# Patient Record
Sex: Male | Born: 1990 | Race: White | Hispanic: No | Marital: Single | Smoking: Former smoker
Health system: Southern US, Community
[De-identification: ages and names within clinical notes are randomized; demographics above are authoritative.]

## PROBLEM LIST (undated history)

## (undated) HISTORY — PX: SPINE SURGERY: SHX786

---
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
## 2010-09-24 LAB — CBC
HCT: 46.4 % (ref 39.0–52.0)
MCHC: 34.6 g/dL (ref 30.0–36.0)
MCV: 91.4 fL (ref 78.0–100.0)
Platelets: 190 10*3/uL (ref 150–400)
RDW: 13.1 % (ref 11.5–15.5)

## 2010-09-24 LAB — BASIC METABOLIC PANEL
BUN: 10 mg/dL (ref 6–23)
CO2: 25 mEq/L (ref 19–32)
Chloride: 106 mEq/L (ref 96–112)
Glucose, Bld: 88 mg/dL (ref 70–99)
Potassium: 3.4 mEq/L — ABNORMAL LOW (ref 3.5–5.1)

## 2010-09-24 LAB — DIFFERENTIAL
Basophils Absolute: 0 10*3/uL (ref 0.0–0.1)
Eosinophils Absolute: 0.1 10*3/uL (ref 0.0–0.7)
Eosinophils Relative: 1 % (ref 0–5)
Monocytes Absolute: 0.7 10*3/uL (ref 0.1–1.0)

## 2012-06-01 ENCOUNTER — Emergency Department
Admission: EM | Admit: 2012-06-01 | Discharge: 2012-06-01 | Disposition: A | Discharge status: Home / Self Care | Payer: 59 | Source: Home / Self Care | Attending: Emergency Medicine | Admitting: Emergency Medicine

## 2012-06-01 ENCOUNTER — Encounter: Payer: Self-pay | Admitting: *Deleted

## 2012-06-01 DIAGNOSIS — J029 Acute pharyngitis, unspecified: Secondary | ICD-10-CM

## 2012-06-01 DIAGNOSIS — J069 Acute upper respiratory infection, unspecified: Secondary | ICD-10-CM

## 2012-06-01 LAB — POCT RAPID STREP A (OFFICE): Rapid Strep A Screen: NEGATIVE

## 2012-06-01 MED ORDER — AZITHROMYCIN 250 MG PO TABS: ORAL_TABLET | ORAL | Status: DC

## 2012-06-01 NOTE — ED Provider Notes (Signed)
History     CSN: [id number]  Arrival date & time 06/01/12  1448   First MD Initiated Contact with Patient 06/01/12 1505      Chief Complaint  Patient presents with  . Allergic Rhinitis   . Nasal Congestion    (Consider location/radiation/quality/duration/timing/severity/associated sxs/prior treatment) HPI Edward Hughes is a 21 y.o. male who complains of onset of cold symptoms for 2-3 days.  The symptoms are constant and mild-moderate in severity.  Father is also sick.  He missed work today and needs a note.  Works at [employer].   + sore throat + cough No pleuritic pain No wheezing + nasal congestion.  Has a h/o allergic rhinitis. + post-nasal drainage + sinus pain/pressure No chest congestion No itchy/red eyes No earache No hemoptysis No SOB + chills/sweats (mild today) + fever (102 the first night) No nausea No vomiting No abdominal pain No diarrhea No skin rashes + fatigue No myalgias No headache    History reviewed. No pertinent past medical history.  History reviewed. No pertinent past surgical history.  Family History  Problem Relation Age of Onset  . Hyperlipidemia Mother   . Hypertension Mother     History  Substance Use Topics  . Smoking status: Former Games developer  . Smokeless tobacco: Not on file  . Alcohol Use: No      Review of Systems  All other systems reviewed and are negative.    Allergies  Review of patient's allergies indicates no known allergies.  Home Medications   Current Outpatient Rx  Name  Route  Sig  Dispense  Refill  . AZITHROMYCIN 250 MG PO TABS      Use as directed   1 each   0     BP 128/83  Pulse 92  Temp 98.1 F (36.7 C) (Oral)  Resp 16  Ht 6\' 2"  (1.88 m)  Wt 143 lb (64.864 kg)  BMI 18.36 kg/m2  SpO2 97%  Physical Exam  Nursing note and vitals reviewed. Constitutional: He is oriented to person, place, and time. He appears well-developed and well-nourished.  HENT:  Head: Normocephalic and atraumatic.   Right Ear: Tympanic membrane, external ear and ear canal normal.  Left Ear: Tympanic membrane, external ear and ear canal normal.  Nose: Mucosal edema and rhinorrhea present.  Mouth/Throat: Posterior oropharyngeal erythema present. No oropharyngeal exudate or posterior oropharyngeal edema.  Eyes: No scleral icterus.  Neck: Neck supple.  Cardiovascular: Regular rhythm and normal heart sounds.   Pulmonary/Chest: Effort normal and breath sounds normal. No respiratory distress.  Neurological: He is alert and oriented to person, place, and time.  Skin: Skin is warm and dry.  Psychiatric: He has a normal mood and affect. His speech is normal.    ED Course  Procedures (including critical care time)   Labs Reviewed  POCT RAPID STREP A (OFFICE)   No results found.   1. Acute upper respiratory infections of unspecified site   2. Acute pharyngitis       MDM  1)  Take the prescribed antibiotic as instructed.  Wait for a few days since likely still viral.  If worsening or new symptoms, can take.  Rapid strep neg.  No culture done. 2)  Use nasal saline solution (over the counter) at least 3 times a day. 3)  Use over the counter decongestants like Zyrtec-D every 12 hours as needed to help with congestion.  If you have hypertension, do not take medicines with sudafed.  4)  Can take tylenol  every 6 hours or motrin every 8 hours for pain or fever. 5)  Follow up with your primary doctor if no improvement in 5-7 days, sooner if increasing pain, fever, or new symptoms.          Marlaine Hind, MD 06/01/12 [phone]

## 2012-06-01 NOTE — ED Notes (Signed)
Patient c/o rhinitis, headache, dry cough, and sore throat x 2 days. Has tried OTC Mucinex DM and Tylenol.

## 2013-05-12 ENCOUNTER — Telehealth: Payer: Self-pay | Admitting: Nurse Practitioner

## 2013-05-12 NOTE — Telephone Encounter (Signed)
APPT MADE

## 2013-05-13 ENCOUNTER — Encounter (INDEPENDENT_AMBULATORY_CARE_PROVIDER_SITE_OTHER): Payer: Self-pay

## 2013-05-13 ENCOUNTER — Ambulatory Visit (INDEPENDENT_AMBULATORY_CARE_PROVIDER_SITE_OTHER): Payer: 59 | Admitting: Family Medicine

## 2013-05-13 ENCOUNTER — Encounter: Payer: Self-pay | Admitting: Family Medicine

## 2013-05-13 ENCOUNTER — Ambulatory Visit: Payer: Self-pay | Admitting: Family Medicine

## 2013-05-13 VITALS — BP 124/82 | HR 70 | Temp 98.0°F | Ht 74.0 in | Wt 163.2 lb

## 2013-05-13 DIAGNOSIS — J069 Acute upper respiratory infection, unspecified: Secondary | ICD-10-CM

## 2013-05-13 NOTE — Progress Notes (Signed)
   Subjective:    Patient ID: Edward Hughes, male    DOB: 1990-06-23, 22 y.o.   MRN: [id number]  HPI This 22 y.o. male presents for evaluation of URI sx's for over a week.  He states He has been having mucopurulent sinus drainage for over a week.  He has sinus Pressure and he has a cough.   Review of Systems    No chest pain, SOB, HA, dizziness, vision change, N/V, diarrhea, constipation, dysuria, urinary urgency or frequency, myalgias, arthralgias or rash.  Objective:   Physical Exam  Vital signs noted  Well developed well nourished male.  HEENT - Head atraumatic Normocephalic                Eyes - PERRLA, Conjuctiva - clear Sclera- Clear EOMI                Ears - EAC's Wnl TM's Wnl Gross Hearing WNL                Throat - oropharanx wnl Respiratory - Lungs CTA bilateral Cardiac - RRR S1 and S2 without murmur GI - Abdomen soft Nontender and bowel sounds active x 4      Assessment & Plan:   URI (upper respiratory infection) Amoxicillin 250mg /66ml two tsp po tid x 10 days #314ml Tessalon perles 100mg  one po tid prn cough #30 Push po fluids, rest, and take tylenol and motrin otc as directed Work note for yesterday given.  Deatra Canter FNP

## 2014-08-03 ENCOUNTER — Telehealth: Payer: Self-pay | Admitting: Family Medicine

## 2014-08-03 NOTE — Telephone Encounter (Signed)
Appointment scheduled for tomorrow with Christell ConstantMoore.

## 2014-08-04 ENCOUNTER — Encounter: Payer: Self-pay | Admitting: Family Medicine

## 2014-08-04 ENCOUNTER — Ambulatory Visit (INDEPENDENT_AMBULATORY_CARE_PROVIDER_SITE_OTHER): Payer: 59 | Admitting: Family Medicine

## 2014-08-04 VITALS — BP 132/89 | HR 71 | Temp 97.0°F | Ht 74.0 in | Wt 175.0 lb

## 2014-08-04 DIAGNOSIS — J069 Acute upper respiratory infection, unspecified: Secondary | ICD-10-CM

## 2014-08-04 DIAGNOSIS — H6983 Other specified disorders of Eustachian tube, bilateral: Secondary | ICD-10-CM

## 2014-08-04 DIAGNOSIS — M67431 Ganglion, right wrist: Secondary | ICD-10-CM

## 2014-08-04 MED ORDER — AMOXICILLIN 250 MG/5ML PO SUSR: 500.0000 mg | Freq: Three times a day (TID) | ORAL | Status: DC

## 2014-08-04 NOTE — Progress Notes (Signed)
Subjective:    Patient ID: Edward Hughes, male    DOB: 02/20/1991, 24 y.o.   MRN: [id number]  HPI 24 year old male comes in today with complaint of bilateral ear pain for 1 week. He has taken Sudafed with no relief. Last taken yesterday.  He also complains of a nodule on his right wrist. No known injury.  There are no active problems to display for this patient.  Outpatient Encounter Prescriptions as of 08/04/2014  Medication Sig  . [DISCONTINUED] azithromycin (ZITHROMAX Z-PAK) 250 MG tablet Use as directed     Review of Systems  Constitutional: Negative.   HENT: Positive for ear pain (bilateral), rhinorrhea and sneezing. Negative for ear discharge, sinus pressure and sore throat.   Eyes: Negative.   Respiratory: Negative.   Cardiovascular: Negative.   Gastrointestinal: Negative.   Endocrine: Negative.   Genitourinary: Negative.   Musculoskeletal: Negative.   Skin: Negative.   Allergic/Immunologic: Negative.   Neurological: Positive for headaches.  Hematological: Negative.   Psychiatric/Behavioral: Negative.        Objective:   Physical Exam  Constitutional: He is oriented to person, place, and time. He appears well-developed and well-nourished. No distress.  HENT:  Head: Normocephalic and atraumatic.  Right Ear: External ear normal.  Left Ear: External ear normal.  Mouth/Throat: Oropharynx is clear and moist. No oropharyngeal exudate.  Nasal congestion left greater than right  Eyes: Conjunctivae and EOM are normal. Pupils are equal, round, and reactive to light. Right eye exhibits no discharge. Left eye exhibits no discharge. No scleral icterus.  Neck: Normal range of motion. Neck supple. No thyromegaly present.  No anterior cervical adenopathy or tenderness  Cardiovascular: Normal rate, regular rhythm and normal heart sounds.  Exam reveals no friction rub.   No murmur heard. Pulmonary/Chest: Effort normal and breath sounds normal. No respiratory distress. He has no  wheezes. He has no rales. He exhibits no tenderness.  Dry cough  Abdominal: Soft. Bowel sounds are normal. He exhibits no mass. There is no tenderness. There is no rebound and no guarding.  Musculoskeletal: Normal range of motion. He exhibits no tenderness.  Lymphadenopathy:    He has no cervical adenopathy.  Neurological: He is alert and oriented to person, place, and time.  Skin: Skin is warm and dry. No rash noted.  Psychiatric: He has a normal mood and affect. His behavior is normal. Judgment and thought content normal.  Nursing note and vitals reviewed.   BP 132/89 mmHg  Pulse 71  Temp(Src) 97 F (36.1 C) (Oral)  Ht 6\' 2"  (1.88 m)  Wt 175 lb (79.379 kg)  BMI 22.46 kg/m2  Repeat blood pressure 128/85 left arm sitting      Assessment & Plan:  1. URI (upper respiratory infection) -Patient should take Tylenol or ibuprofen as needed for aches and pains - amoxicillin (AMOXIL) 250 MG/5ML suspension; Take 10 mLs (500 mg total) by mouth 3 (three) times daily.  Dispense: 150 mL; Refill: 0  2. Eustachian tube dysfunction, bilateral -He should try some Claritin over-the-counter for congestion - amoxicillin (AMOXIL) 250 MG/5ML suspension; Take 10 mLs (500 mg total) by mouth 3 (three) times daily.  Dispense: 150 mL; Refill: 0  3. Ganglion cyst of wrist, right -If he continues to have problems with the wrist i.e. increased pain or enlarging ganglion, the patient should get back in touch with us and we will arrange for him to see the orthopedic surgeon  Patient Instructions  Use a cool mist humidifier  in your home  Keep the home as cool as possible and drink plenty of fluids Use nasal saline and Mucinex if needed for cough and congestion Take antibiotic until completed If you develop pain with your wrist where this ganglion cyst is located please get back in touch with [place] and we will have an orthopedic surgeon see you Take Tylenol or ibuprofen as needed for aches pains and  fever   Nyra Capes MD

## 2014-08-04 NOTE — Patient Instructions (Addendum)
Use a cool mist humidifier in your home  Keep the home as cool as possible and drink plenty of fluids Use nasal saline and Mucinex if needed for cough and congestion Take antibiotic until completed If you develop pain with your wrist where this ganglion cyst is located please get back in touch with us and we will have an orthopedic surgeon see you Take Tylenol or ibuprofen as needed for aches pains and fever

## 2014-12-22 ENCOUNTER — Telehealth: Payer: Self-pay | Admitting: Family Medicine

## 2014-12-23 NOTE — Telephone Encounter (Signed)
[facility]oke with pt's mother and [facility]

## 2015-11-02 ENCOUNTER — Ambulatory Visit (INDEPENDENT_AMBULATORY_CARE_PROVIDER_SITE_OTHER): Payer: Self-pay | Admitting: Physician Assistant

## 2015-11-02 ENCOUNTER — Encounter: Payer: Self-pay | Admitting: Physician Assistant

## 2015-11-02 VITALS — BP 136/96 | HR 81 | Temp 98.1°F | Ht 74.0 in | Wt 186.0 lb

## 2015-11-02 DIAGNOSIS — H109 Unspecified conjunctivitis: Secondary | ICD-10-CM

## 2015-11-02 MED ORDER — TOBRAMYCIN 0.3 % OP SOLN: 2.0000 [drp] | OPHTHALMIC | Status: DC

## 2015-11-02 NOTE — Patient Instructions (Addendum)
Bacterial Conjunctivitis Bacterial conjunctivitis, commonly called pink eye, is an inflammation of the clear membrane that covers the white part of the eye (conjunctiva). The inflammation can also happen on the underside of the eyelids. The blood vessels in the conjunctiva become inflamed, causing the eye to become red or pink. Bacterial conjunctivitis may spread easily from one eye to another and from person to person (contagious).  CAUSES  Bacterial conjunctivitis is caused by bacteria. The bacteria may come from your own skin, your upper respiratory tract, or from someone else with bacterial conjunctivitis. SYMPTOMS  The normally white color of the eye or the underside of the eyelid is usually pink or red. The pink eye is usually associated with irritation, tearing, and some sensitivity to light. Bacterial conjunctivitis is often associated with a thick, yellowish discharge from the eye. The discharge may turn into a crust on the eyelids overnight, which causes your eyelids to stick together. If a discharge is present, there may also be some blurred vision in the affected eye. DIAGNOSIS  Bacterial conjunctivitis is diagnosed by your caregiver through an eye exam and the symptoms that you report. Your caregiver looks for changes in the surface tissues of your eyes, which may point to the specific type of conjunctivitis. A sample of any discharge may be collected on a cotton-tip swab if you have a severe case of conjunctivitis, if your cornea is affected, or if you keep getting repeat infections that do not respond to treatment. The sample will be sent to a lab to see if the inflammation is caused by a bacterial infection and to see if the infection will respond to antibiotic medicines. TREATMENT   Bacterial conjunctivitis is treated with antibiotics. Antibiotic eyedrops are most often used. However, antibiotic ointments are also available. Antibiotics pills are sometimes used. Artificial tears or eye  washes may ease discomfort. HOME CARE INSTRUCTIONS   To ease discomfort, apply a cool, clean washcloth to your eye for 10-20 minutes, 3-4 times a day.  Gently wipe away any drainage from your eye with a warm, wet washcloth or a cotton ball.  Wash your hands often with soap and water. Use paper towels to dry your hands.  Do not share towels or washcloths. This may spread the infection.  Change or wash your pillowcase every day.  You should not use eye makeup until the infection is gone.  Do not operate machinery or drive if your vision is blurred.  Stop using contact lenses. Ask your caregiver how to sterilize or replace your contacts before using them again. This depends on the type of contact lenses that you use.  When applying medicine to the infected eye, do not touch the edge of your eyelid with the eyedrop bottle or ointment tube. SEEK IMMEDIATE MEDICAL CARE IF:   Your infection has not improved within 3 days after beginning treatment.  You had yellow discharge from your eye and it returns.  You have increased eye pain.  Your eye redness is spreading.  Your vision becomes blurred.  You have a fever or persistent symptoms for more than 2-3 days.  You have a fever and your symptoms suddenly get worse.  You have facial pain, redness, or swelling. MAKE SURE YOU:   Understand these instructions.  Will watch your condition.  Will get help right away if you are not doing well or get worse.   This information is not intended to replace advice given to you by your health care provider. Make sure you   discuss any questions you have with your health care provider.   Document Released: 06/04/2005 Document Revised: 06/25/2014 Document Reviewed: 11/05/2011 Elsevier Interactive Patient Education 2016 [employer] Corneal Abrasion The cornea is the clear covering at the front and center of the eye. When looking at the colored portion of the eye (iris), you are looking through  the cornea. This very thin tissue is made up of many layers. The surface layer is a single layer of cells (corneal epithelium) and is one of the most sensitive tissues in the body. If a scratch or injury causes the corneal epithelium to come off, it is called a corneal abrasion. If the injury extends to the tissues below the epithelium, the condition is called a corneal ulcer. CAUSES   Scratches.  Trauma.  Foreign body in the eye. Some people have recurrences of abrasions in the area of the original injury even after it has healed (recurrent erosion syndrome). Recurrent erosion syndrome generally improves and goes away with time. SYMPTOMS   Eye pain.  Difficulty or inability to keep the injured eye open.  The eye becomes very sensitive to light.  Recurrent erosions tend to happen suddenly, first thing in the morning, usually after waking up and opening the eye. DIAGNOSIS  Your health care provider can diagnose a corneal abrasion during an eye exam. Dye is usually placed in the eye using a drop or a small paper strip moistened by your tears. When the eye is examined with a special light, the abrasion shows up clearly because of the dye. TREATMENT   Small abrasions may be treated with antibiotic drops or ointment alone.  A pressure patch may be put over the eye. If this is done, follow your doctor's instructions for when to remove the patch. Do not drive or use machines while the eye patch is on. Judging distances is hard to do with a patch on. If the abrasion becomes infected and spreads to the deeper tissues of the cornea, a corneal ulcer can result. This is serious because it can cause corneal scarring. Corneal scars interfere with light passing through the cornea and cause a loss of vision in the involved eye. HOME CARE INSTRUCTIONS  Use medicine or ointment as directed. Only take over-the-counter or prescription medicines for pain, discomfort, or fever as directed by your health care  provider.  Do not drive or operate machinery if your eye is patched. Your ability to judge distances is impaired.  If your health care provider has given you a follow-up appointment, it is very important to keep that appointment. Not keeping the appointment could result in a severe eye infection or permanent loss of vision. If there is any problem keeping the appointment, let your health care provider know. SEEK MEDICAL CARE IF:   You have pain, light sensitivity, and a scratchy feeling in one eye or both eyes.  Your pressure patch keeps loosening up, and you can blink your eye under the patch after treatment.  Any kind of discharge develops from the eye after treatment or if the lids stick together in the morning.  You have the same symptoms in the morning as you did with the original abrasion days, weeks, or months after the abrasion healed.   This information is not intended to replace advice given to you by your health care provider. Make sure you discuss any questions you have with your health care provider.   Document Released: 06/01/2000 Document Revised: 02/23/2015 Document Reviewed: 02/09/2013 Elsevier Interactive Patient Education  2016 [employer].

## 2015-11-02 NOTE — Progress Notes (Signed)
Subjective:     Patient ID: Dolores HooseDustin M Jr, male   DOB: 05/12/1991, 25 y.o.   MRN: [id number]  HPI Pt with pain and redness to the L eye Sx have improved but with some matting Denies change in vision  Review of Systems Denies photophobia No change in vision + swelling to the to the lid    Objective:   Physical Exam + upper lid edema No matting to the lashes on the L ey + conjunct. Erythema PERRLA EOMI Fundo nl Fluro stain- no abrasion No pre-aur nodes   Assessment:     1. Conjunctivitis of left eye        Plan:     Discussed this may of been an abrasion that has improved Tobrex to cover possible infection Freq handwashing F/U prn

## 2015-11-04 ENCOUNTER — Telehealth: Payer: Self-pay | Admitting: Family Medicine

## 2015-11-04 NOTE — Telephone Encounter (Signed)
Pt informed eye gtts Rx'd other day was an abx which will take care of this.

## 2018-07-23 ENCOUNTER — Encounter: Payer: Self-pay | Admitting: Physician Assistant

## 2018-07-23 ENCOUNTER — Ambulatory Visit: Payer: Commercial Managed Care - PPO | Admitting: Physician Assistant

## 2018-07-23 VITALS — BP 148/93 | HR 95 | Temp 97.8°F | Ht 74.0 in | Wt 206.0 lb

## 2018-07-23 DIAGNOSIS — B349 Viral infection, unspecified: Secondary | ICD-10-CM

## 2018-07-23 DIAGNOSIS — R52 Pain, unspecified: Secondary | ICD-10-CM | POA: Diagnosis not present

## 2018-07-23 LAB — VERITOR FLU A/B WAIVED
Influenza A: NEGATIVE
Influenza B: NEGATIVE

## 2018-07-23 NOTE — Progress Notes (Signed)
BP (!) 148/93   Pulse 95   Temp 97.8 F (36.6 C) (Oral)   Ht 6\' 2"  (1.88 m)   Wt 206 lb (93.4 kg)   BMI 26.45 kg/m    Subjective:    Patient ID: Edward Hughes, male    DOB: February 03, 1991, 28 y.o.   MRN: [id number]  HPI: Edward Hughes is a 28 y.o. male presenting on 07/23/2018 for Headache; Cough; and Generalized Body Aches    No past medical history on file. Relevant past medical, surgical, family and social history reviewed and updated as indicated. Interim medical history since our last visit reviewed. Allergies and medications reviewed and updated. DATA REVIEWED: CHART IN EPIC  Family History reviewed for pertinent findings.  Review of Systems  Constitutional: Positive for fatigue. Negative for appetite change.  HENT: Positive for sinus pressure and sore throat.   Eyes: Negative.  Negative for pain and visual disturbance.  Respiratory: Positive for shortness of breath and wheezing. Negative for cough and chest tightness.   Cardiovascular: Negative.  Negative for chest pain, palpitations and leg swelling.  Gastrointestinal: Negative.  Negative for abdominal pain, diarrhea, nausea and vomiting.  Endocrine: Negative.   Genitourinary: Negative.   Musculoskeletal: Positive for back pain and myalgias.  Skin: Negative.  Negative for color change and rash.  Neurological: Positive for headaches. Negative for weakness and numbness.  Psychiatric/Behavioral: Negative.     Allergies as of 07/23/2018   No Known Allergies     Medication List    as of July 23, 2018 10:32 AM   You have not been prescribed any medications.        Objective:    BP (!) 148/93   Pulse 95   Temp 97.8 F (36.6 C) (Oral)   Ht 6\' 2"  (1.88 m)   Wt 206 lb (93.4 kg)   BMI 26.45 kg/m   No Known Allergies  Wt Readings from Last 3 Encounters:  07/23/18 206 lb (93.4 kg)  11/02/15 186 lb (84.4 kg)  08/04/14 175 lb (79.4 kg)    Physical Exam Vitals signs and nursing note reviewed.    Constitutional:      General: He is not in acute distress.    Appearance: He is well-developed.  HENT:     Head: Normocephalic and atraumatic.     Right Ear: Tympanic membrane and ear canal normal. No drainage. No middle ear effusion.     Left Ear: Tympanic membrane and ear canal normal. No drainage.  No middle ear effusion.     Nose: Mucosal edema and rhinorrhea present.     Right Sinus: No maxillary sinus tenderness.     Left Sinus: No maxillary sinus tenderness.     Mouth/Throat:     Pharynx: Uvula midline. Posterior oropharyngeal erythema present. No oropharyngeal exudate.  Eyes:     General:        Right eye: No discharge.        Left eye: No discharge.     Conjunctiva/sclera: Conjunctivae normal.     Pupils: Pupils are equal, round, and reactive to light.  Neck:     Musculoskeletal: Normal range of motion.  Cardiovascular:     Rate and Rhythm: Normal rate and regular rhythm.     Heart sounds: Normal heart sounds.  Pulmonary:     Effort: Pulmonary effort is normal. No respiratory distress.     Breath sounds: Normal breath sounds. No wheezing.  Abdominal:     Palpations: Abdomen is soft.  Lymphadenopathy:     Cervical: No cervical adenopathy.  Skin:    General: Skin is warm and dry.  Neurological:     Mental Status: He is alert and oriented to person, place, and time.  Psychiatric:        Behavior: Behavior normal.         Assessment & Plan:   1. Body aches - Veritor Flu A/B Waived  2. Viral illness NEGATIVE FLU - Veritor Flu A/B Waived - Take meds as prescribed - Use a cool mist humidifier  -Use saline nose sprays frequently -Force fluids -For any cough or congestion  Use plain Mucinex- regular strength or max strength is fine -For fever or aces or pains- take tylenol or ibuprofen. -Throat lozenges if help -New toothbrush in 3 days  Delsym 2-3 tsp BID for cough     Continue all other maintenance medications as listed above.  Follow up plan: No  follow-ups on file.  Educational handout given for viral support  Remus Loffler PA-C [facility] [address]  [place], [place] [address] [phone]   07/23/2018, 10:32 AM

## 2018-07-23 NOTE — Patient Instructions (Signed)
-   Take meds as prescribed - Use a cool mist humidifier  -Use saline nose sprays frequently -Force fluids -For any cough or congestion  Use plain Mucinex- regular strength or max strength is fine -For fever or aces or pains- take tylenol or ibuprofen. -Throat lozenges if help -New toothbrush in 3 days  Delsym 3 tsp BID

## 2019-04-29 ENCOUNTER — Ambulatory Visit (INDEPENDENT_AMBULATORY_CARE_PROVIDER_SITE_OTHER): Payer: Self-pay | Admitting: Family Medicine

## 2019-04-29 ENCOUNTER — Encounter: Payer: Self-pay | Admitting: Family Medicine

## 2019-04-29 ENCOUNTER — Other Ambulatory Visit: Payer: Self-pay

## 2019-04-29 DIAGNOSIS — Z20822 Contact with and (suspected) exposure to covid-19: Secondary | ICD-10-CM

## 2019-04-29 DIAGNOSIS — J069 Acute upper respiratory infection, unspecified: Secondary | ICD-10-CM

## 2019-04-29 MED ORDER — FLUTICASONE PROPIONATE 50 MCG/ACT NA SUSP: 1.0000 | Freq: Two times a day (BID) | NASAL | 6 refills | Status: AC | PRN

## 2019-04-29 NOTE — Progress Notes (Signed)
   Virtual Visit via telephone Note  I connected with Edward Hughes on 04/29/19 at 1326 by telephone and verified that I am speaking with the correct person using two identifiers. Edward Hughes is currently located at home and no other people are currently with her during visit. The provider, Fransisca Kaufmann Hinton Luellen, MD is located in their office at time of visit.  Call ended at 1335  I discussed the limitations, risks, security and privacy concerns of performing an evaluation and management service by telephone and the availability of in person appointments. I also discussed with the patient that there may be a patient responsible charge related to this service. The patient expressed understanding and agreed to proceed.   History and Present Illness: Patient is calling in with complaints of headaches and congestion and body aches.  He felt feverish and chills and it started yesterday.  He has voice change but no sore throat or shortness of breath. He took nyquil last night and feels better except sinus headache. He denies any sick contacts at work that he knows of.  His ears have pressure.   No diagnosis found.  No outpatient encounter medications on file as of 04/29/2019.   No facility-administered encounter medications on file as of 04/29/2019.     Review of Systems  Constitutional: Positive for chills and fever.  HENT: Positive for congestion, ear pain, postnasal drip, rhinorrhea, sinus pressure, sneezing and sore throat. Negative for ear discharge and voice change.   Eyes: Negative for pain, discharge, redness and visual disturbance.  Respiratory: Negative for shortness of breath and wheezing.   Cardiovascular: Negative for chest pain and leg swelling.  Musculoskeletal: Positive for myalgias. Negative for gait problem.  Skin: Negative for rash.  All other systems reviewed and are negative.   Observations/Objective: Patient sounds comfortable and in no acute distress  Assessment and  Plan: Problem List Items Addressed This Visit    None    Visit Diagnoses    Viral upper respiratory illness    -  Primary   Relevant Medications   fluticasone (FLONASE) 50 MCG/ACT nasal spray   Other Relevant Orders   Novel Coronavirus, NAA ([facility])       Follow Up Instructions:  Recommended flonase and covid testing and quarantine unitl results.    I discussed the assessment and treatment plan with the patient. The patient was provided an opportunity to ask questions and all were answered. The patient agreed with the plan and demonstrated an understanding of the instructions.   The patient was advised to call back or seek an in-person evaluation if the symptoms worsen or if the condition fails to improve as anticipated.  The above assessment and management plan was discussed with the patient. The patient verbalized understanding of and has agreed to the management plan. Patient is aware to call the clinic if symptoms persist or worsen. Patient is aware when to return to the clinic for a follow-up visit. Patient educated on when it is appropriate to go to the emergency department.    I provided 9 minutes of non-face-to-face time during this encounter.    Worthy Rancher, MD

## 2019-05-01 LAB — NOVEL CORONAVIRUS, NAA: SARS-CoV-2, NAA: NOT DETECTED

## 2019-05-11 ENCOUNTER — Telehealth: Payer: Self-pay | Admitting: Family Medicine

## 2019-05-11 MED ORDER — AMOXICILLIN-POT CLAVULANATE 250-62.5 MG/5ML PO SUSR: 500.0000 mg | Freq: Two times a day (BID) | ORAL | 0 refills | Status: AC

## 2019-05-11 NOTE — Telephone Encounter (Signed)
Patient aware.

## 2019-05-11 NOTE — Telephone Encounter (Signed)
Yes go ahead and do a note for work today

## 2019-05-11 NOTE — Telephone Encounter (Signed)
Let him know that I sent in liquid amoxicillin Caryl Pina, MD [place] 05/11/2019, 8:59 AM

## 2019-05-11 NOTE — Telephone Encounter (Signed)
Patient had a tele visit 11/11 with Dr. Warrick Parisian and states he is no better. Requesting abx sent to pharmacy.

## 2019-05-11 NOTE — Telephone Encounter (Signed)
lmtcb-   Let has not been done.  Patient can not come into office to pick up due to symptoms.

## 2019-05-11 NOTE — Telephone Encounter (Signed)
Letter uploaded to [employer]- patient aware

## 2019-06-25 ENCOUNTER — Encounter: Payer: Self-pay | Admitting: Family Medicine

## 2019-06-25 ENCOUNTER — Ambulatory Visit (INDEPENDENT_AMBULATORY_CARE_PROVIDER_SITE_OTHER): Payer: Self-pay | Admitting: Family Medicine

## 2019-06-25 DIAGNOSIS — G43009 Migraine without aura, not intractable, without status migrainosus: Secondary | ICD-10-CM | POA: Insufficient documentation

## 2019-06-25 MED ORDER — TOPIRAMATE 25 MG PO TABS: 25.0000 mg | ORAL_TABLET | Freq: Every day | ORAL | 1 refills | Status: DC

## 2019-06-25 NOTE — Patient Instructions (Addendum)
Children's Motrin 20-30 mL every 6-8 hours as needed for pain/fever. This can be your treatment of headaches if they occur.    Migraine Headache A migraine headache is a very strong throbbing pain on one side or both sides of your head. This type of headache can also cause other symptoms. It can last from 4 hours to 3 days. Talk with your doctor about what things may bring on (trigger) this condition. What are the causes? The exact cause of this condition is not known. This condition may be triggered or caused by:  Drinking alcohol.  Smoking.  Taking medicines, such as: ? Medicine used to treat chest pain (nitroglycerin). ? Birth control pills. ? Estrogen. ? Some blood pressure medicines.  Eating or drinking certain products.  Doing physical activity. Other things that may trigger a migraine headache include:  Having a menstrual period.  Pregnancy.  Hunger.  Stress.  Not getting enough sleep or getting too much sleep.  Weather changes.  Tiredness (fatigue). What increases the risk?  Being 30-40 years old.  Being male.  Having a family history of migraine headaches.  Being Caucasian.  Having depression or anxiety.  Being very overweight. What are the signs or symptoms?  A throbbing pain. This pain may: ? Happen in any area of the head, such as on one side or both sides. ? Make it hard to do daily activities. ? Get worse with physical activity. ? Get worse around bright lights or loud noises.  Other symptoms may include: ? Feeling sick to your stomach (nauseous). ? Vomiting. ? Dizziness. ? Being sensitive to bright lights, loud noises, or smells.  Before you get a migraine headache, you may get warning signs (an aura). An aura may include: ? Seeing flashing lights or having blind spots. ? Seeing bright spots, halos, or zigzag lines. ? Having tunnel vision or blurred vision. ? Having numbness or a tingling feeling. ? Having trouble talking. ? Having  weak muscles.  Some people have symptoms after a migraine headache (postdromal phase), such as: ? Tiredness. ? Trouble thinking (concentrating). How is this treated?  Taking medicines that: ? Relieve pain. ? Relieve the feeling of being sick to your stomach. ? Prevent migraine headaches.  Treatment may also include: ? Having acupuncture. ? Avoiding foods that bring on migraine headaches. ? Learning ways to control your body functions (biofeedback). ? Therapy to help you know and deal with negative thoughts (cognitive behavioral therapy). Follow these instructions at home: Medicines  Take over-the-counter and prescription medicines only as told by your doctor.  Ask your doctor if the medicine prescribed to you: ? Requires you to avoid driving or using heavy machinery. ? Can cause trouble pooping (constipation). You may need to take these steps to prevent or treat trouble pooping:  Drink enough fluid to keep your pee (urine) pale yellow.  Take over-the-counter or prescription medicines.  Eat foods that are high in fiber. These include beans, whole grains, and fresh fruits and vegetables.  Limit foods that are high in fat and sugar. These include fried or sweet foods. Lifestyle  Do not drink alcohol.  Do not use any products that contain nicotine or tobacco, such as cigarettes, e-cigarettes, and chewing tobacco. If you need help quitting, ask your doctor.  Get at least 8 hours of sleep every night.  Limit and deal with stress. General instructions      Keep a journal to find out what may bring on your migraine headaches. For example, write  down: ? What you eat and drink. ? How much sleep you get. ? Any change in what you eat or drink. ? Any change in your medicines.  If you have a migraine headache: ? Avoid things that make your symptoms worse, such as bright lights. ? It may help to lie down in a dark, quiet room. ? Do not drive or use heavy machinery. ? Ask  your doctor what activities are safe for you.  Keep all follow-up visits as told by your doctor. This is important. Contact a doctor if:  You get a migraine headache that is different or worse than others you have had.  You have more than 15 headache days in one month. Get help right away if:  Your migraine headache gets very bad.  Your migraine headache lasts longer than 72 hours.  You have a fever.  You have a stiff neck.  You have trouble seeing.  Your muscles feel weak or like you cannot control them.  You start to lose your balance a lot.  You start to have trouble walking.  You pass out (faint).  You have a seizure. Summary  A migraine headache is a very strong throbbing pain on one side or both sides of your head. These headaches can also cause other symptoms.  This condition may be treated with medicines and changes to your lifestyle.  Keep a journal to find out what may bring on your migraine headaches.  Contact a doctor if you get a migraine headache that is different or worse than others you have had.  Contact your doctor if you have more than 15 headache days in a month. This information is not intended to replace advice given to you by your health care provider. Make sure you discuss any questions you have with your health care provider. Document Revised: 09/26/2018 Document Reviewed: 07/17/2018 Elsevier Patient Education  2020 [employer].

## 2019-06-25 NOTE — Progress Notes (Signed)
Virtual Visit via Telephone Note  I connected with Edward Hughes on 06/25/19 at 3:18 PM by telephone and verified that I am speaking with the correct person using two identifiers. Edward Hughes is currently located at home and nobody is currently with him during this visit. The provider, Loman Brooklyn, FNP is located in their office at time of visit.  I discussed the limitations, risks, security and privacy concerns of performing an evaluation and management service by telephone and the availability of in person appointments. I also discussed with the patient that there may be a patient responsible charge related to this service. The patient expressed understanding and agreed to proceed.  Subjective: PCP: Dettinger, Fransisca Kaufmann, MD  Chief Complaint  Patient presents with  . Headache   Headache: Patient complains of headache. He does have a headache at this time.   Description of Headaches: Location of pain: temporal, parietal, frontal Radiation of pain?:none Character of pain:pulsating and throbbing Severity of pain: 7 Accompanying symptoms: nausea, sonophobia, photophobia, vertigo Prodromal sx?: can sometimes tell it is coming but cannot explain how he knows Rapidity of onset: gradual Typical duration of individual headache: 1 day Are most headaches similar in presentation? yes Typical precipitants: unknown  Temporal Pattern of Headaches: Started having HAs a few years ago Worst time of day: nothing specific Awaken from sleep?: no Seasonal pattern?: no 'Clustering' of HAs over time? no Overall pattern since problem began: unchanged  Degree of Functional Impairment: intermittent  Current Use of Meds to Treat HA: Abortive meds? NSAIDs (Goody's Powders) Daily use? yes Prophylactic meds? none  Additional Relevant History: History of head/neck trauma? no History of head/neck surgery? no Family h/o headache problems? yes - mother Use of meds that might worsen HAs?  no Exposure to carbon monoxide? no Substance use: caffeine: Dr. Malachi Bonds 72 oz   ROS: Per HPI  Current Outpatient Medications:  .  fluticasone (FLONASE) 50 MCG/ACT nasal spray, Place 1 spray into both nostrils 2 (two) times daily as needed for allergies or rhinitis., Disp: 16 g, Rfl: 6  No Known Allergies History reviewed. No pertinent past medical history.  Observations/Objective: A&O  No respiratory distress or wheezing audible over the phone Mood, judgement, and thought processes all WNL  Assessment and Plan: 1. Migraine without aura and without status migrainosus, not intractable - Education provided on migraine headaches.  Started patient on preventive medication -Topamax 25 mg at bedtime.  Advised patient that after a week on the 25 mg if he has had some improvement but needs more he may increase to 50 mg at bedtime.  Since he has been unable to take medication to treat headaches besides Goody powders I have recommended that he try ibuprofen.  He is unable to swallow pills therefore he is going to purchase the Children's Motrin and take 20 to 30 mL which will be 400 to 600 mL every 6-8 hours as needed for headaches.  He will open the Topamax capsule and sprinkle into either pudding, applesauce, or yogurt. - topiramate (TOPAMAX) 25 MG tablet; Take 1 tablet (25 mg total) by mouth at bedtime.  Dispense: 30 tablet; Refill: 1   Follow Up Instructions:  I discussed the assessment and treatment plan with the patient. The patient was provided an opportunity to ask questions and all were answered. The patient agreed with the plan and demonstrated an understanding of the instructions.   The patient was advised to call back or seek an in-person evaluation if the symptoms  worsen or if the condition fails to improve as anticipated.  The above assessment and management plan was discussed with the patient. The patient verbalized understanding of and has agreed to the management plan. Patient is  aware to call the clinic if symptoms persist or worsen. Patient is aware when to return to the clinic for a follow-up visit. Patient educated on when it is appropriate to go to the emergency department.   Time call ended: 3:43 PM  I provided 27 minutes of non-face-to-face time during this encounter.  Deliah Boston, MSN, APRN, FNP-C [facility] 06/25/19

## 2019-08-20 ENCOUNTER — Other Ambulatory Visit: Payer: Self-pay | Admitting: Family Medicine

## 2019-08-20 DIAGNOSIS — G43009 Migraine without aura, not intractable, without status migrainosus: Secondary | ICD-10-CM

## 2019-12-31 ENCOUNTER — Ambulatory Visit (INDEPENDENT_AMBULATORY_CARE_PROVIDER_SITE_OTHER): Payer: Self-pay | Admitting: Family

## 2019-12-31 ENCOUNTER — Encounter: Payer: Self-pay | Admitting: Family

## 2019-12-31 DIAGNOSIS — G43009 Migraine without aura, not intractable, without status migrainosus: Secondary | ICD-10-CM

## 2019-12-31 MED ORDER — ONDANSETRON 4 MG PO TBDP: 4.0000 mg | ORAL_TABLET | Freq: Three times a day (TID) | ORAL | 0 refills | Status: AC | PRN

## 2019-12-31 NOTE — Progress Notes (Signed)
   Virtual Visit via telephone Note Due to COVID-19 pandemic this visit was conducted virtually. This visit type was conducted due to national recommendations for restrictions regarding the COVID-19 Pandemic (e.g. social distancing, sheltering in place) in an effort to limit this patient's exposure and mitigate transmission in our community. All issues noted in this document were discussed and addressed.  A physical exam was not performed with this format.  I connected with Edward Hughes on 12/31/19 at 12:48 pm  by telephone and verified that I am speaking with the correct person using two identifiers. Edward Hughes is currently located at home and no one is currently with him during visit. The provider, Jannifer Rodney, FNP is located in their office at time of visit.  I discussed the limitations, risks, security and privacy concerns of performing an evaluation and management service by telephone and the availability of in person appointments. I also discussed with the patient that there may be a patient responsible charge related to this service. The patient expressed understanding and agreed to proceed.   History and Present Illness:  Emesis  This is a new problem. The current episode started yesterday. The problem occurs 2 to 4 times per day. The problem has been resolved. The emesis has an appearance of stomach contents. There has been no fever. Pertinent negatives include no diarrhea or myalgias. He has tried bed rest and increased fluids for the symptoms.  Migraine  This is a chronic problem. The current episode started yesterday. The problem occurs every few minutes. The problem has been resolved. The pain is located in the temporal region. The pain quality is similar to prior headaches. The quality of the pain is described as aching. The pain is at a severity of 8/10. The pain is moderate. Associated symptoms include nausea, phonophobia, photophobia and vomiting. He has tried darkened room for  the symptoms. The treatment provided mild relief.      Review of Systems  Eyes: Positive for photophobia.  Gastrointestinal: Positive for nausea and vomiting. Negative for diarrhea.  Musculoskeletal: Negative for myalgias.  All other systems reviewed and are negative.    Observations/Objective: No SOB or distress noted   Assessment and Plan: 1. 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 The patient was provided an opportunity to ask questions and all were answered. The patient agreed with the plan and demonstrated an understanding of the instructions.   The patient was advised to call back or seek an in-person evaluation if the symptoms worsen or if the condition fails to improve as anticipated.  The above assessment and management plan was discussed with the patient. The patient verbalized understanding of and has agreed to the management plan. Patient is aware to call the clinic if symptoms persist or worsen. Patient is aware when to return to the clinic for a follow-up visit. Patient educated on when it is appropriate to go to the emergency department.   Time call ended:  1:04 pm   I provided 16 minutes of non-face-to-face time during this encounter.    Jannifer Rodney, FNP

## 2020-07-04 ENCOUNTER — Encounter: Payer: Self-pay | Admitting: Family Medicine

## 2020-10-20 ENCOUNTER — Ambulatory Visit: Payer: Self-pay | Admitting: Family Medicine

## 2020-10-21 ENCOUNTER — Encounter: Payer: Self-pay | Admitting: Family Medicine

## 2021-01-19 ENCOUNTER — Ambulatory Visit: Payer: Self-pay | Admitting: Family Medicine

## 2021-01-24 ENCOUNTER — Encounter: Payer: Self-pay | Admitting: Family Medicine

## 2021-02-16 ENCOUNTER — Ambulatory Visit: Payer: Self-pay | Admitting: Family Medicine

## 2021-02-22 ENCOUNTER — Encounter: Payer: Self-pay | Admitting: Family Medicine
